# Patient Record
Sex: Male | Born: 1950 | Race: White | Hispanic: No | Marital: Married | Smoking: Former smoker
Health system: Southern US, Community
[De-identification: ages and names within clinical notes are randomized; demographics above are authoritative.]

---

## 2010-05-10 ENCOUNTER — Ambulatory Visit (HOSPITAL_COMMUNITY): Admission: RE | Admit: 2010-05-10 | Discharge: 2010-05-10 | Payer: Self-pay | Admitting: Gastroenterology

## 2013-01-19 ENCOUNTER — Other Ambulatory Visit: Payer: Self-pay | Admitting: Otolaryngology

## 2018-09-29 DIAGNOSIS — J309 Allergic rhinitis, unspecified: Secondary | ICD-10-CM | POA: Diagnosis not present

## 2018-09-29 DIAGNOSIS — I10 Essential (primary) hypertension: Secondary | ICD-10-CM | POA: Diagnosis not present

## 2018-09-29 DIAGNOSIS — Z136 Encounter for screening for cardiovascular disorders: Secondary | ICD-10-CM | POA: Diagnosis not present

## 2018-09-29 DIAGNOSIS — E7800 Pure hypercholesterolemia, unspecified: Secondary | ICD-10-CM | POA: Diagnosis not present

## 2018-09-29 DIAGNOSIS — Z23 Encounter for immunization: Secondary | ICD-10-CM | POA: Diagnosis not present

## 2018-09-30 ENCOUNTER — Other Ambulatory Visit: Payer: Self-pay | Admitting: Family Medicine

## 2018-09-30 DIAGNOSIS — Z136 Encounter for screening for cardiovascular disorders: Secondary | ICD-10-CM

## 2018-10-08 ENCOUNTER — Ambulatory Visit
Admission: RE | Admit: 2018-10-08 | Discharge: 2018-10-08 | Disposition: A | Discharge status: Home / Self Care | Payer: Medicare PPO | Source: Ambulatory Visit | Attending: Family Medicine | Admitting: Family Medicine

## 2018-10-08 DIAGNOSIS — Z136 Encounter for screening for cardiovascular disorders: Secondary | ICD-10-CM

## 2018-10-08 DIAGNOSIS — Z87891 Personal history of nicotine dependence: Secondary | ICD-10-CM | POA: Diagnosis not present

## 2019-04-01 DIAGNOSIS — Z125 Encounter for screening for malignant neoplasm of prostate: Secondary | ICD-10-CM | POA: Diagnosis not present

## 2019-04-01 DIAGNOSIS — Z0000 Encounter for general adult medical examination without abnormal findings: Secondary | ICD-10-CM | POA: Diagnosis not present

## 2019-04-01 DIAGNOSIS — E7800 Pure hypercholesterolemia, unspecified: Secondary | ICD-10-CM | POA: Diagnosis not present

## 2019-04-01 DIAGNOSIS — I10 Essential (primary) hypertension: Secondary | ICD-10-CM | POA: Diagnosis not present

## 2019-04-01 DIAGNOSIS — J309 Allergic rhinitis, unspecified: Secondary | ICD-10-CM | POA: Diagnosis not present

## 2019-04-01 DIAGNOSIS — Z1389 Encounter for screening for other disorder: Secondary | ICD-10-CM | POA: Diagnosis not present

## 2019-04-06 DIAGNOSIS — Z125 Encounter for screening for malignant neoplasm of prostate: Secondary | ICD-10-CM | POA: Diagnosis not present

## 2019-04-06 DIAGNOSIS — E7800 Pure hypercholesterolemia, unspecified: Secondary | ICD-10-CM | POA: Diagnosis not present

## 2019-04-06 DIAGNOSIS — Z23 Encounter for immunization: Secondary | ICD-10-CM | POA: Diagnosis not present

## 2019-04-06 DIAGNOSIS — I10 Essential (primary) hypertension: Secondary | ICD-10-CM | POA: Diagnosis not present

## 2019-06-15 DIAGNOSIS — E7800 Pure hypercholesterolemia, unspecified: Secondary | ICD-10-CM | POA: Diagnosis not present

## 2019-07-26 DIAGNOSIS — Z23 Encounter for immunization: Secondary | ICD-10-CM | POA: Diagnosis not present

## 2019-10-04 DIAGNOSIS — J309 Allergic rhinitis, unspecified: Secondary | ICD-10-CM | POA: Diagnosis not present

## 2019-10-04 DIAGNOSIS — I10 Essential (primary) hypertension: Secondary | ICD-10-CM | POA: Diagnosis not present

## 2019-10-04 DIAGNOSIS — E7800 Pure hypercholesterolemia, unspecified: Secondary | ICD-10-CM | POA: Diagnosis not present

## 2020-04-03 DIAGNOSIS — J309 Allergic rhinitis, unspecified: Secondary | ICD-10-CM | POA: Diagnosis not present

## 2020-04-03 DIAGNOSIS — Z125 Encounter for screening for malignant neoplasm of prostate: Secondary | ICD-10-CM | POA: Diagnosis not present

## 2020-04-03 DIAGNOSIS — Z6833 Body mass index (BMI) 33.0-33.9, adult: Secondary | ICD-10-CM | POA: Diagnosis not present

## 2020-04-03 DIAGNOSIS — I10 Essential (primary) hypertension: Secondary | ICD-10-CM | POA: Diagnosis not present

## 2020-04-03 DIAGNOSIS — E7800 Pure hypercholesterolemia, unspecified: Secondary | ICD-10-CM | POA: Diagnosis not present

## 2020-04-03 DIAGNOSIS — Z0000 Encounter for general adult medical examination without abnormal findings: Secondary | ICD-10-CM | POA: Diagnosis not present

## 2020-04-03 DIAGNOSIS — Z1389 Encounter for screening for other disorder: Secondary | ICD-10-CM | POA: Diagnosis not present

## 2020-07-05 DIAGNOSIS — H2513 Age-related nuclear cataract, bilateral: Secondary | ICD-10-CM | POA: Diagnosis not present

## 2020-10-22 DIAGNOSIS — Z6831 Body mass index (BMI) 31.0-31.9, adult: Secondary | ICD-10-CM | POA: Diagnosis not present

## 2020-10-22 DIAGNOSIS — J309 Allergic rhinitis, unspecified: Secondary | ICD-10-CM | POA: Diagnosis not present

## 2020-10-22 DIAGNOSIS — E7800 Pure hypercholesterolemia, unspecified: Secondary | ICD-10-CM | POA: Diagnosis not present

## 2020-10-22 DIAGNOSIS — I10 Essential (primary) hypertension: Secondary | ICD-10-CM | POA: Diagnosis not present

## 2020-11-07 IMAGING — US US ABDOMINAL AORTA SCREENING AAA
1 series · 14 of 20 positions shown · non-contrast
Comparison: None.

CLINICAL DATA: Appropriate age.  First time exam.  Smoking history.

EXAM:
US ABDOMINAL AORTA MEDICARE SCREENING
TECHNIQUE: Ultrasound examination of the abdominal aorta was performed as a
screening evaluation for abdominal aortic aneurysm.

[Series 1: us abdominal aorta screening aaa · 0.36mm/px · 14 of 20 slices shown]
[im 1/20]
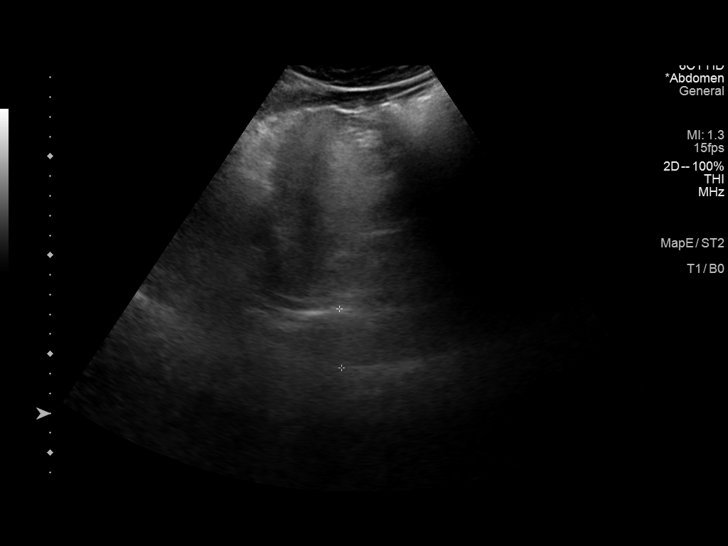
[im 3/20]
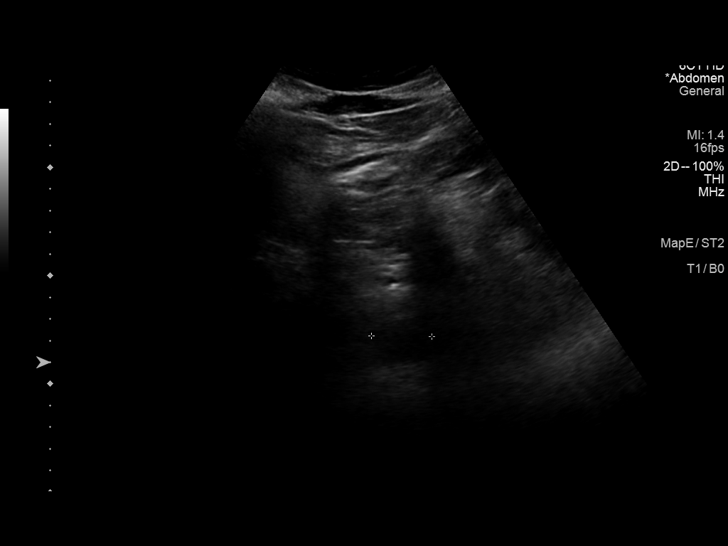
[im 4/20]
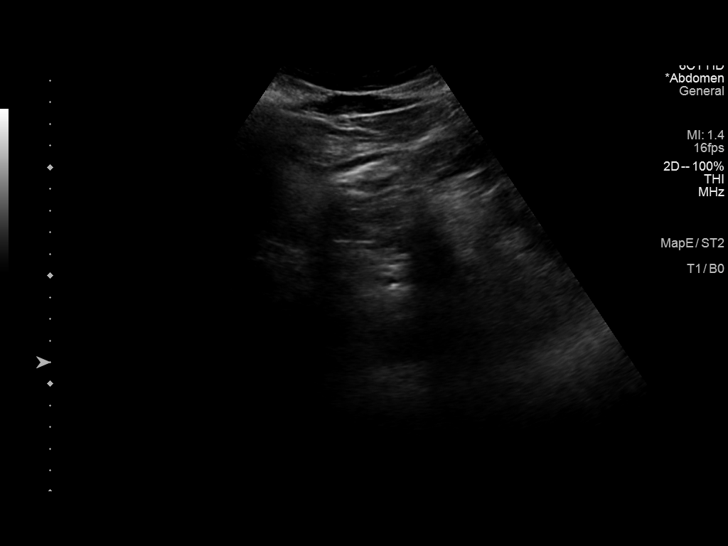
[im 6/20]
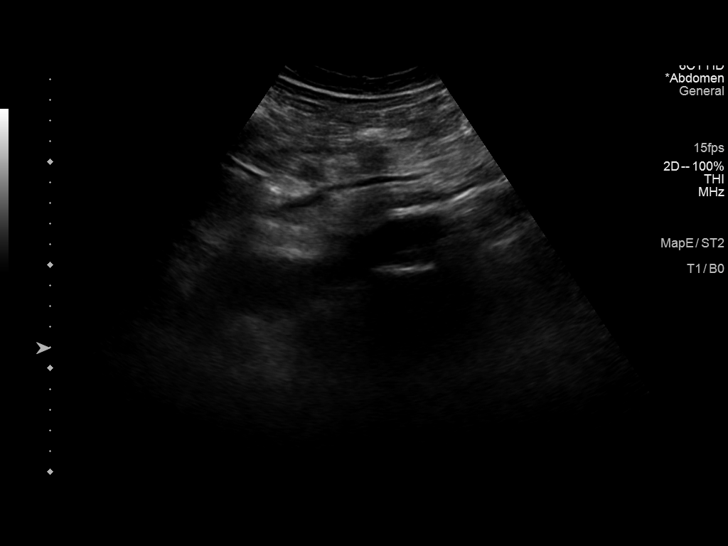
[im 7/20]
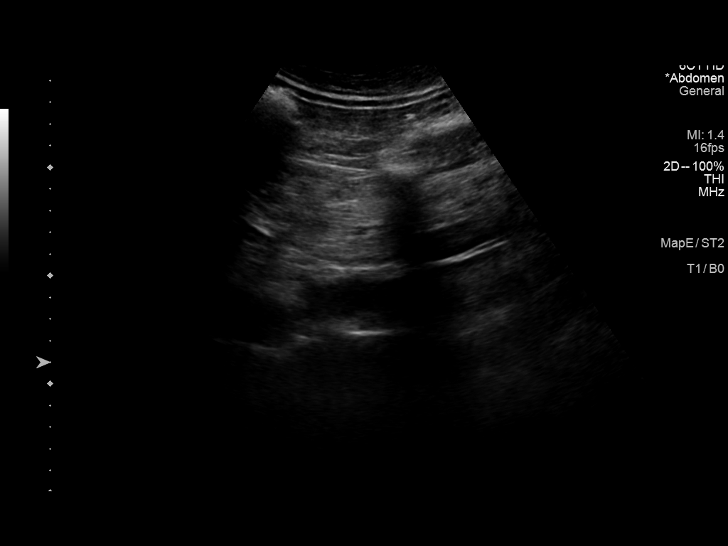
[im 8/20]
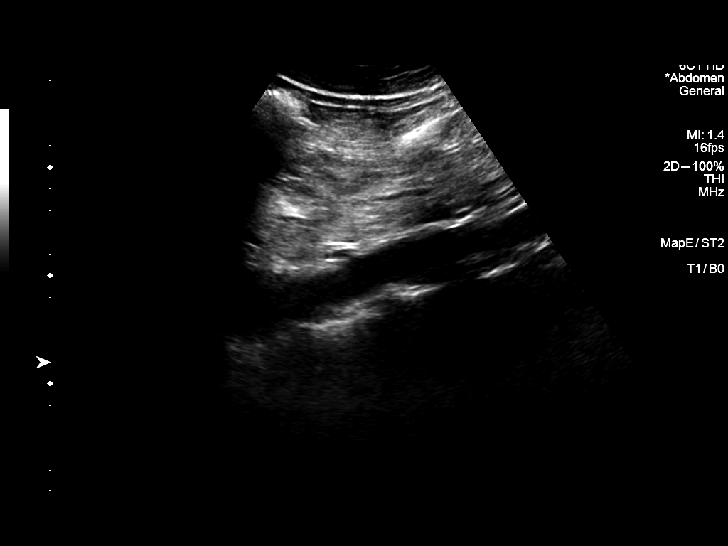
[im 10/20]
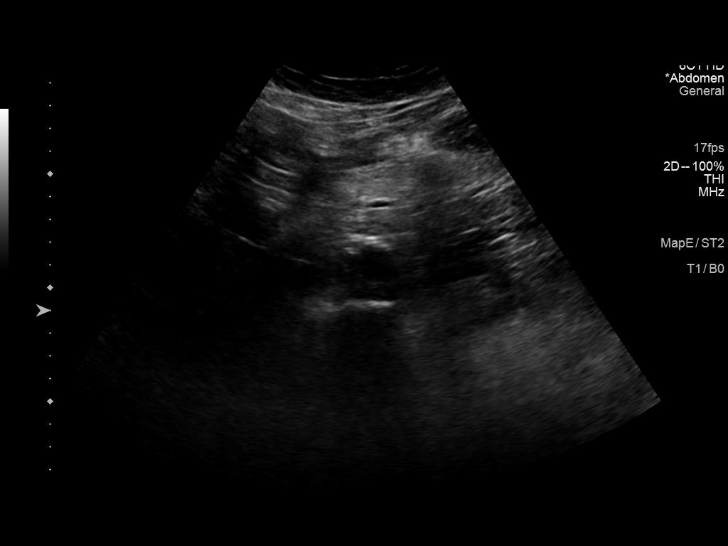
[im 11/20]
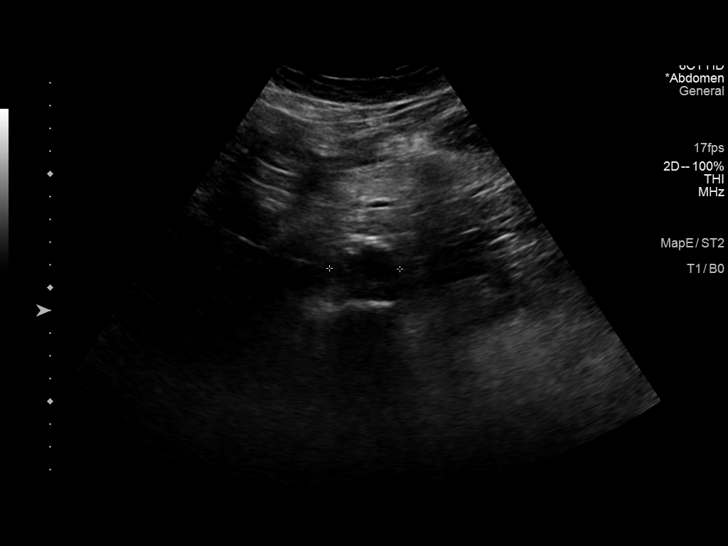
[im 13/20]
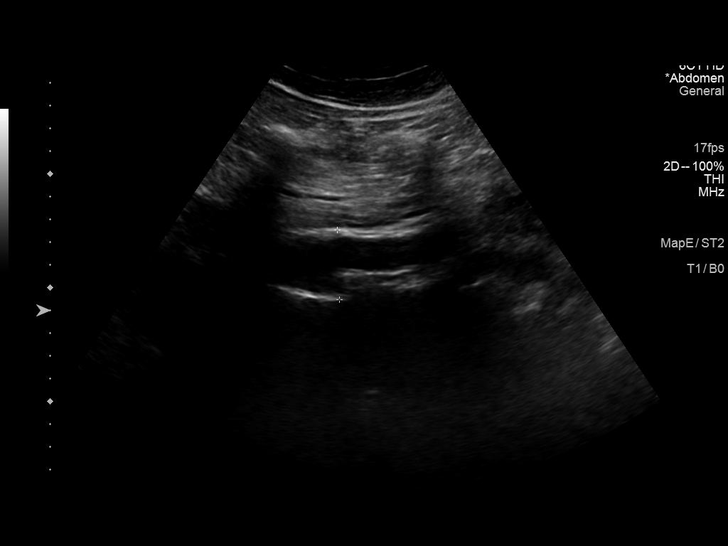
[im 14/20]
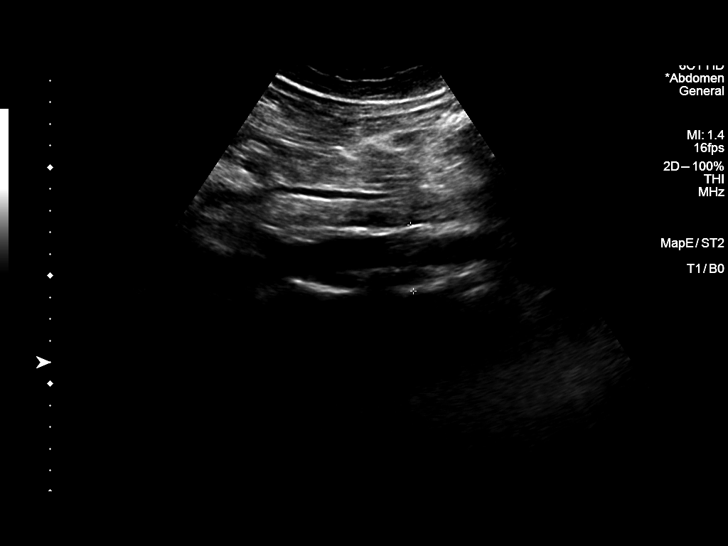
[im 16/20]
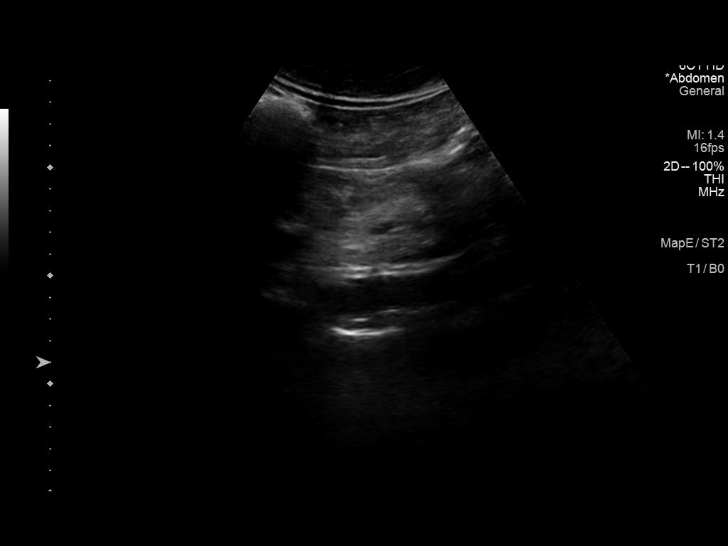
[im 17/20]
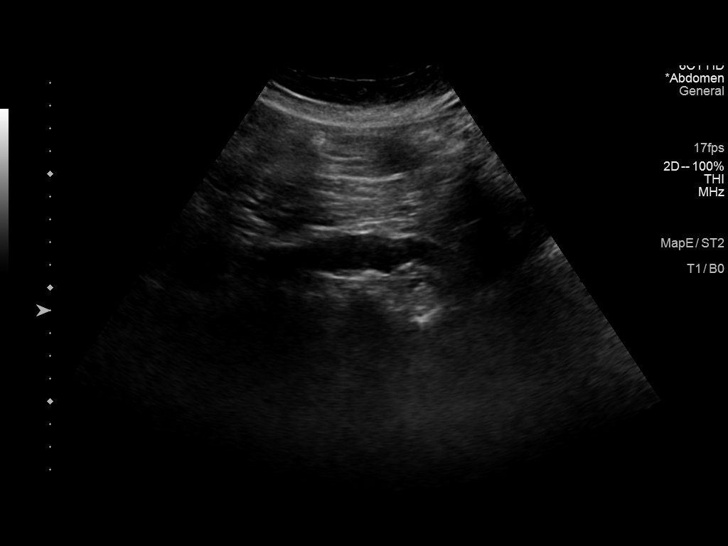
[im 18/20]
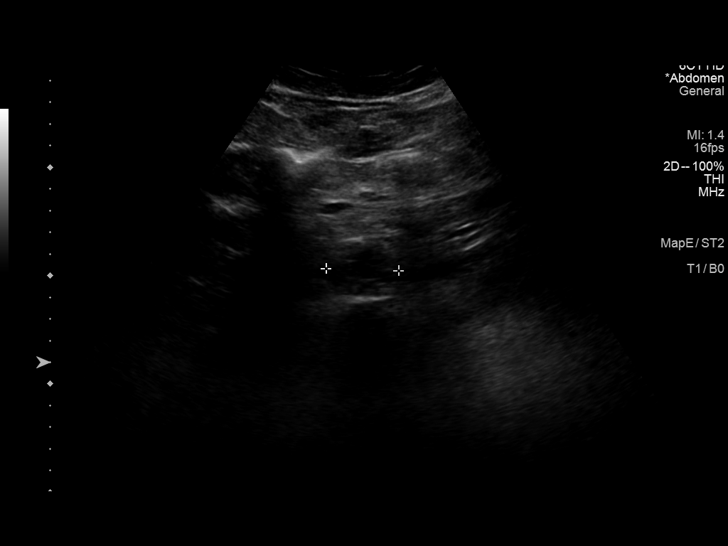
[im 20/20]
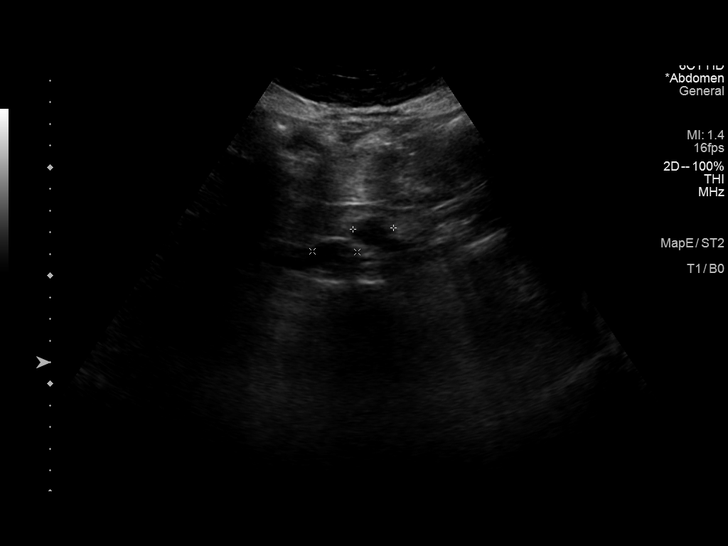

[14 of 20 positions shown; findings below may reference images not displayed]

FINDINGS: Abdominal aortic measurements as follows:

Proximal:  3.0 cm

Mid:  3.2 cm

Distal:  3.4 cm

Irregular atherosclerotic plaque is noted in the abdominal aorta.
IMPRESSION: Maximal diameter of the abdominal aorta is 3.4 cm. Recommend
followup by ultrasound in 3 years. This recommendation follows ACR
consensus guidelines: White Paper of the ACR Incidental Findings

## 2021-04-16 DIAGNOSIS — J309 Allergic rhinitis, unspecified: Secondary | ICD-10-CM | POA: Diagnosis not present

## 2021-04-16 DIAGNOSIS — Z125 Encounter for screening for malignant neoplasm of prostate: Secondary | ICD-10-CM | POA: Diagnosis not present

## 2021-04-16 DIAGNOSIS — Z0000 Encounter for general adult medical examination without abnormal findings: Secondary | ICD-10-CM | POA: Diagnosis not present

## 2021-04-16 DIAGNOSIS — M25561 Pain in right knee: Secondary | ICD-10-CM | POA: Diagnosis not present

## 2021-04-16 DIAGNOSIS — Z1389 Encounter for screening for other disorder: Secondary | ICD-10-CM | POA: Diagnosis not present

## 2021-04-16 DIAGNOSIS — I10 Essential (primary) hypertension: Secondary | ICD-10-CM | POA: Diagnosis not present

## 2021-04-16 DIAGNOSIS — E669 Obesity, unspecified: Secondary | ICD-10-CM | POA: Diagnosis not present

## 2021-04-16 DIAGNOSIS — E7800 Pure hypercholesterolemia, unspecified: Secondary | ICD-10-CM | POA: Diagnosis not present

## 2021-10-23 DIAGNOSIS — E669 Obesity, unspecified: Secondary | ICD-10-CM | POA: Diagnosis not present

## 2021-10-23 DIAGNOSIS — J309 Allergic rhinitis, unspecified: Secondary | ICD-10-CM | POA: Diagnosis not present

## 2021-10-23 DIAGNOSIS — E7800 Pure hypercholesterolemia, unspecified: Secondary | ICD-10-CM | POA: Diagnosis not present

## 2021-10-23 DIAGNOSIS — I77819 Aortic ectasia, unspecified site: Secondary | ICD-10-CM | POA: Diagnosis not present

## 2021-10-23 DIAGNOSIS — M25561 Pain in right knee: Secondary | ICD-10-CM | POA: Diagnosis not present

## 2021-10-23 DIAGNOSIS — I10 Essential (primary) hypertension: Secondary | ICD-10-CM | POA: Diagnosis not present

## 2021-10-24 ENCOUNTER — Other Ambulatory Visit: Payer: Self-pay | Admitting: Family Medicine

## 2021-10-24 DIAGNOSIS — I77819 Aortic ectasia, unspecified site: Secondary | ICD-10-CM

## 2021-11-04 ENCOUNTER — Ambulatory Visit
Admission: RE | Admit: 2021-11-04 | Discharge: 2021-11-04 | Disposition: A | Discharge status: Home / Self Care | Payer: Medicare PPO | Source: Ambulatory Visit | Attending: Family Medicine | Admitting: Family Medicine

## 2021-11-04 ENCOUNTER — Other Ambulatory Visit: Payer: Self-pay

## 2021-11-04 DIAGNOSIS — I77819 Aortic ectasia, unspecified site: Secondary | ICD-10-CM

## 2021-12-31 DIAGNOSIS — E7800 Pure hypercholesterolemia, unspecified: Secondary | ICD-10-CM | POA: Diagnosis not present

## 2022-05-13 DIAGNOSIS — Z1331 Encounter for screening for depression: Secondary | ICD-10-CM | POA: Diagnosis not present

## 2022-05-13 DIAGNOSIS — M25561 Pain in right knee: Secondary | ICD-10-CM | POA: Diagnosis not present

## 2022-05-13 DIAGNOSIS — I10 Essential (primary) hypertension: Secondary | ICD-10-CM | POA: Diagnosis not present

## 2022-05-13 DIAGNOSIS — I77819 Aortic ectasia, unspecified site: Secondary | ICD-10-CM | POA: Diagnosis not present

## 2022-05-13 DIAGNOSIS — E7800 Pure hypercholesterolemia, unspecified: Secondary | ICD-10-CM | POA: Diagnosis not present

## 2022-05-13 DIAGNOSIS — Z125 Encounter for screening for malignant neoplasm of prostate: Secondary | ICD-10-CM | POA: Diagnosis not present

## 2022-05-13 DIAGNOSIS — J309 Allergic rhinitis, unspecified: Secondary | ICD-10-CM | POA: Diagnosis not present

## 2022-05-13 DIAGNOSIS — Z0000 Encounter for general adult medical examination without abnormal findings: Secondary | ICD-10-CM | POA: Diagnosis not present

## 2022-05-13 DIAGNOSIS — E669 Obesity, unspecified: Secondary | ICD-10-CM | POA: Diagnosis not present

## 2022-06-30 ENCOUNTER — Other Ambulatory Visit: Payer: Self-pay | Admitting: *Deleted

## 2022-06-30 DIAGNOSIS — Z87891 Personal history of nicotine dependence: Secondary | ICD-10-CM

## 2022-06-30 DIAGNOSIS — Z122 Encounter for screening for malignant neoplasm of respiratory organs: Secondary | ICD-10-CM

## 2022-07-29 ENCOUNTER — Encounter: Payer: Self-pay | Admitting: Acute Care

## 2022-07-29 ENCOUNTER — Ambulatory Visit (INDEPENDENT_AMBULATORY_CARE_PROVIDER_SITE_OTHER): Payer: Medicare PPO | Admitting: Acute Care

## 2022-07-29 DIAGNOSIS — Z87891 Personal history of nicotine dependence: Secondary | ICD-10-CM

## 2022-07-29 NOTE — Progress Notes (Signed)
Virtual Visit via Telephone Note  I connected with Aaron Leach on 07/29/22 at  1:30 PM EST by telephone and verified that I am speaking with the correct person using two identifiers.  Location: Patient:  At home Provider:  [address] [place], [place], [address]    I discussed the limitations, risks, security and privacy concerns of performing an evaluation and management service by telephone and the availability of in person appointments. I also discussed with the patient that there may be a patient responsible charge related to this service. The patient expressed understanding and agreed to proceed.   Shared Decision Making Visit Lung Cancer Screening Program [phone])   Eligibility: Age 77 y.o. Pack Years Smoking History Calculation 62.5  pack year smoking history (# packs/per year x # years smoked) Recent History of coughing up blood  no Unexplained weight loss? no ( >Than 15 pounds within the last 6 months ) Prior History Lung / other cancer no (Diagnosis within the last 5 years already requiring surveillance chest CT Scans). Smoking Status Former Smoker Former Smokers: Years since quit: 10 years  Quit Date: 2013  Visit Components: Discussion included one or more decision making aids. yes Discussion included risk/benefits of screening. yes Discussion included potential follow up diagnostic testing for abnormal scans. yes Discussion included meaning and risk of over diagnosis. yes Discussion included meaning and risk of False Positives. yes Discussion included meaning of total radiation exposure. yes  Counseling Included: Importance of adherence to annual lung cancer LDCT screening. yes Impact of comorbidities on ability to participate in the program. yes Ability and willingness to under diagnostic treatment. yes  Smoking Cessation Counseling: Current Smokers:  Discussed importance of smoking cessation. yes Information about tobacco cessation classes and  interventions provided to patient. yes Patient provided with "ticket" for LDCT Scan. yes Symptomatic Patient. no  Counseling NA Diagnosis Code: Tobacco Use Z72.0 Asymptomatic Patient yes  Counseling (Intermediate counseling: > three minutes counseling) [id number] Former Smokers:  Discussed the importance of maintaining cigarette abstinence. yes Diagnosis Code: Personal History of Nicotine Dependence. W29.562 Information about tobacco cessation classes and interventions provided to patient. Yes Patient provided with "ticket" for LDCT Scan. yes Written Order for Lung Cancer Screening with LDCT placed in Epic. Yes (CT Chest Lung Cancer Screening Low Dose W/O CM) [id number] Z12.2-Screening of respiratory organs Z87.891-Personal history of nicotine dependence  I spent 25 minutes of face to face time/virtual visit time  with  Mr. Beasley discussing the risks and benefits of lung cancer screening. We took the time to pause the power point  Mr. Wachter intervals to allow for questions to be asked and answered to ensure understanding. We discussed that he had taken the single most powerful action possible to decrease his risk of developing lung cancer when he quit smoking. I counseled him to remain smoke free, and to contact me if he ever had the desire to smoke again so that I can provide resources and tools to help support the effort to remain smoke free. We discussed the time and location of the scan, and that either  Abigail Miyamoto RN, Karlton Lemon, RN or I  or I will call / send a letter with the results within  24-72 hours of receiving them. He has the office contact information in the event he needs to speak with me,  he verbalized understanding of all of the above and had no further questions upon leaving the office.     I explained to the patient  that there has been a high incidence of coronary artery disease noted on these exams. I explained that this is a non-gated exam therefore degree or severity  cannot be determined. This patient is on statin therapy. I have asked the patient to follow-up with their PCP regarding any incidental finding of coronary artery disease and management with diet or medication as they feel is clinically indicated. The patient verbalized understanding of the above and had no further questions.    Magdalen Spatz, NP 07/29/2022

## 2022-07-29 NOTE — Patient Instructions (Signed)
Thank you for participating in the [facility]. It was our pleasure to meet you today. We will call you with the results of your scan within the next few days. Your scan will be assigned a Lung RADS category score by the physicians reading the scans.  This Lung RADS score determines follow up scanning.  See below for description of categories, and follow up screening recommendations. We will be in touch to schedule your follow up screening annually or based on recommendations of our providers. We will fax a copy of your scan results to your Primary Care Physician, or the physician who referred you to the program, to ensure they have the results. Please call the office if you have any questions or concerns regarding your scanning experience or results.  Our office number is [phone]. Please speak with Denise Phelps, RN. , or  Denise Buckner RN, They are  our Lung Cancer Screening RN.'s If They are unavailable when you call, Please leave a message on the voice mail. We will return your call at our earliest convenience.This voice mail is monitored several times a day.  Remember, if your scan is normal, we will scan you annually as long as you continue to meet the criteria for the program. (Age 55-77, Current smoker or smoker who has quit within the last 15 years). If you are a smoker, remember, quitting is the single most powerful action that you can take to decrease your risk of lung cancer and other pulmonary, breathing related problems. We know quitting is hard, and we are here to help.  Please let us know if there is anything we can do to help you meet your goal of quitting. If you are a former smoker, congratulations. We are proud of you! Remain smoke free! Remember you can refer friends or family members through the number above.  We will screen them to make sure they meet criteria for the program. Thank you for helping us take better care of you by  participating in Lung Screening.  You can receive free nicotine replacement therapy ( patches, gum or mints) by calling 1-800-QUIT NOW. Please call so we can get you on the path to becoming  a non-smoker. I know it is hard, but you can do this!  Lung RADS Categories:  Lung RADS 1: no nodules or definitely non-concerning nodules.  Recommendation is for a repeat annual scan in 12 months.  Lung RADS 2:  nodules that are non-concerning in appearance and behavior with a very low likelihood of becoming an active cancer. Recommendation is for a repeat annual scan in 12 months.  Lung RADS 3: nodules that are probably non-concerning , includes nodules with a low likelihood of becoming an active cancer.  Recommendation is for a 6-month repeat screening scan. Often noted after an upper respiratory illness. We will be in touch to make sure you have no questions, and to schedule your 6-month scan.  Lung RADS 4 A: nodules with concerning findings, recommendation is most often for a follow up scan in 3 months or additional testing based on our provider's assessment of the scan. We will be in touch to make sure you have no questions and to schedule the recommended 3 month follow up scan.  Lung RADS 4 B:  indicates findings that are concerning. We will be in touch with you to schedule additional diagnostic testing based on our provider's  assessment of the scan.  Other options for assistance in smoking cessation (   As covered by your insurance benefits)  Hypnosis for smoking cessation  [employer] [phone]  Acupuncture for smoking cessation  [facility] [phone]   

## 2022-07-30 ENCOUNTER — Ambulatory Visit
Admission: RE | Admit: 2022-07-30 | Discharge: 2022-07-30 | Disposition: A | Discharge status: Home / Self Care | Payer: Medicare PPO | Source: Ambulatory Visit | Attending: Acute Care | Admitting: Acute Care

## 2022-07-30 DIAGNOSIS — Z122 Encounter for screening for malignant neoplasm of respiratory organs: Secondary | ICD-10-CM

## 2022-07-30 DIAGNOSIS — I358 Other nonrheumatic aortic valve disorders: Secondary | ICD-10-CM | POA: Diagnosis not present

## 2022-07-30 DIAGNOSIS — J432 Centrilobular emphysema: Secondary | ICD-10-CM | POA: Diagnosis not present

## 2022-07-30 DIAGNOSIS — I2510 Atherosclerotic heart disease of native coronary artery without angina pectoris: Secondary | ICD-10-CM | POA: Diagnosis not present

## 2022-07-30 DIAGNOSIS — Z87891 Personal history of nicotine dependence: Secondary | ICD-10-CM | POA: Diagnosis not present

## 2022-07-31 ENCOUNTER — Other Ambulatory Visit: Payer: Self-pay

## 2022-07-31 ENCOUNTER — Telehealth: Payer: Self-pay | Admitting: Acute Care

## 2022-07-31 DIAGNOSIS — R911 Solitary pulmonary nodule: Secondary | ICD-10-CM

## 2022-07-31 DIAGNOSIS — Z87891 Personal history of nicotine dependence: Secondary | ICD-10-CM

## 2022-07-31 NOTE — Telephone Encounter (Signed)
Results of recent LDCT reviewed with patient, using two patient identifiers. Nodule noted in right lung with recommendation to repeat LDCT in 6 months as precaution. No comparison imaging available.  Nodule is likely benign and as precaution, would recommend 6 months review instead of waiting a year.  Patient in agreement.  Also noted was emphysema and atherosclerosis.  Patient was aware he has copd and also takes stating medication. Order placed for nodule follow up CT in 6 months and results/plan faxed to PCP.  Patient had no questions.

## 2022-08-01 DIAGNOSIS — E7800 Pure hypercholesterolemia, unspecified: Secondary | ICD-10-CM | POA: Diagnosis not present

## 2022-10-14 DIAGNOSIS — D123 Benign neoplasm of transverse colon: Secondary | ICD-10-CM | POA: Diagnosis not present

## 2022-10-14 DIAGNOSIS — K635 Polyp of colon: Secondary | ICD-10-CM | POA: Diagnosis not present

## 2022-10-14 DIAGNOSIS — Z09 Encounter for follow-up examination after completed treatment for conditions other than malignant neoplasm: Secondary | ICD-10-CM | POA: Diagnosis not present

## 2022-10-14 DIAGNOSIS — Z86010 Personal history of colonic polyps: Secondary | ICD-10-CM | POA: Diagnosis not present

## 2022-10-14 DIAGNOSIS — K5730 Diverticulosis of large intestine without perforation or abscess without bleeding: Secondary | ICD-10-CM | POA: Diagnosis not present

## 2023-01-20 DIAGNOSIS — H2513 Age-related nuclear cataract, bilateral: Secondary | ICD-10-CM | POA: Diagnosis not present

## 2023-01-20 DIAGNOSIS — H52201 Unspecified astigmatism, right eye: Secondary | ICD-10-CM | POA: Diagnosis not present

## 2023-01-20 DIAGNOSIS — H25013 Cortical age-related cataract, bilateral: Secondary | ICD-10-CM | POA: Diagnosis not present

## 2023-01-20 DIAGNOSIS — H524 Presbyopia: Secondary | ICD-10-CM | POA: Diagnosis not present

## 2023-01-20 DIAGNOSIS — H5211 Myopia, right eye: Secondary | ICD-10-CM | POA: Diagnosis not present

## 2023-02-04 ENCOUNTER — Other Ambulatory Visit: Payer: Medicare PPO

## 2023-02-27 ENCOUNTER — Ambulatory Visit
Admission: RE | Admit: 2023-02-27 | Discharge: 2023-02-27 | Disposition: A | Discharge status: Home / Self Care | Payer: Medicare PPO | Source: Ambulatory Visit | Attending: Acute Care | Admitting: Acute Care

## 2023-02-27 DIAGNOSIS — R911 Solitary pulmonary nodule: Secondary | ICD-10-CM

## 2023-02-27 DIAGNOSIS — Z87891 Personal history of nicotine dependence: Secondary | ICD-10-CM

## 2023-04-03 ENCOUNTER — Telehealth: Payer: Self-pay | Admitting: Acute Care

## 2023-04-03 DIAGNOSIS — Z87891 Personal history of nicotine dependence: Secondary | ICD-10-CM

## 2023-04-03 DIAGNOSIS — R911 Solitary pulmonary nodule: Secondary | ICD-10-CM

## 2023-04-03 NOTE — Telephone Encounter (Signed)
I have called the patient with the results of his low dose CT chest. This was a 6 month follow up after a LR 3 that was done 07/2022.  The June scan was read as a LR 3 also. The previously noted nodules were stable, However , there was a  New part solid nodule of the right lung apex measuring 7.1 mm with 2.5 mm solid component, and a New ground-glass nodule of the upper lobe measuring 5.3 mm. Because of the new nodules, we will do a 6 month follow up to ensure stability. Patient is in agreement with this plan.  Sherre Lain, and Lane, please place order for 6 month follow up low dose CT Chest, and fax results to PCP letting them know plan for follow up. Thanks so much

## 2023-04-06 NOTE — Telephone Encounter (Signed)
Results/ plans faxed to PCP. Order placed for 6 mth nodule f/u lung screening CT.

## 2023-05-15 DIAGNOSIS — Z125 Encounter for screening for malignant neoplasm of prostate: Secondary | ICD-10-CM | POA: Diagnosis not present

## 2023-05-15 DIAGNOSIS — F17201 Nicotine dependence, unspecified, in remission: Secondary | ICD-10-CM | POA: Diagnosis not present

## 2023-05-15 DIAGNOSIS — Z0000 Encounter for general adult medical examination without abnormal findings: Secondary | ICD-10-CM | POA: Diagnosis not present

## 2023-05-15 DIAGNOSIS — J439 Emphysema, unspecified: Secondary | ICD-10-CM | POA: Diagnosis not present

## 2023-05-15 DIAGNOSIS — I700 Atherosclerosis of aorta: Secondary | ICD-10-CM | POA: Diagnosis not present

## 2023-05-15 DIAGNOSIS — I2510 Atherosclerotic heart disease of native coronary artery without angina pectoris: Secondary | ICD-10-CM | POA: Diagnosis not present

## 2023-05-15 DIAGNOSIS — I77819 Aortic ectasia, unspecified site: Secondary | ICD-10-CM | POA: Diagnosis not present

## 2023-05-15 DIAGNOSIS — R7301 Impaired fasting glucose: Secondary | ICD-10-CM | POA: Diagnosis not present

## 2023-05-15 DIAGNOSIS — E7800 Pure hypercholesterolemia, unspecified: Secondary | ICD-10-CM | POA: Diagnosis not present

## 2023-05-15 DIAGNOSIS — I10 Essential (primary) hypertension: Secondary | ICD-10-CM | POA: Diagnosis not present

## 2023-05-25 DIAGNOSIS — E876 Hypokalemia: Secondary | ICD-10-CM | POA: Diagnosis not present

## 2023-08-25 ENCOUNTER — Encounter: Payer: Self-pay | Admitting: Acute Care

## 2023-09-01 ENCOUNTER — Inpatient Hospital Stay
Admission: RE | Admit: 2023-09-01 | Discharge: 2023-09-01 | Disposition: A | Discharge status: Home / Self Care | Payer: Medicare PPO | Source: Ambulatory Visit

## 2023-09-01 DIAGNOSIS — R911 Solitary pulmonary nodule: Secondary | ICD-10-CM

## 2023-09-01 DIAGNOSIS — Z87891 Personal history of nicotine dependence: Secondary | ICD-10-CM

## 2023-09-01 DIAGNOSIS — R918 Other nonspecific abnormal finding of lung field: Secondary | ICD-10-CM | POA: Diagnosis not present

## 2023-09-14 ENCOUNTER — Other Ambulatory Visit: Payer: Self-pay

## 2023-09-14 DIAGNOSIS — Z87891 Personal history of nicotine dependence: Secondary | ICD-10-CM

## 2023-09-14 DIAGNOSIS — Z122 Encounter for screening for malignant neoplasm of respiratory organs: Secondary | ICD-10-CM

## 2023-12-05 IMAGING — US US AORTA
1 series · 13 of 25 positions shown · non-contrast
Comparison: 10/08/2018

CLINICAL DATA: Prior aortic ultrasound demonstrated aneurysmal
dilatation of the abdominal aorta.

EXAM:
ULTRASOUND OF ABDOMINAL AORTA
TECHNIQUE: Ultrasound examination of the abdominal aorta and proximal common
iliac arteries was performed to evaluate for aneurysm. Additional
color and Doppler images of the distal aorta were obtained to
document patency.

[Series 1: us aorta · 0.28mm/px · 13 of 28 slices shown]
[im 1/28]
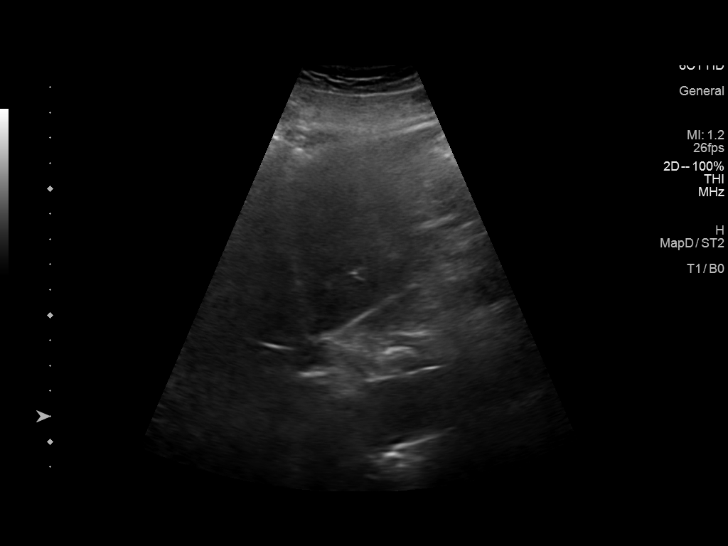
[im 3/28]
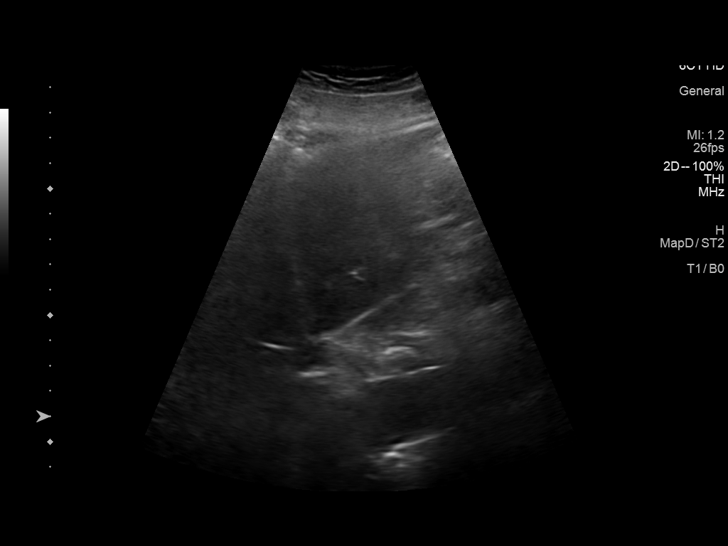
[im 5/28]
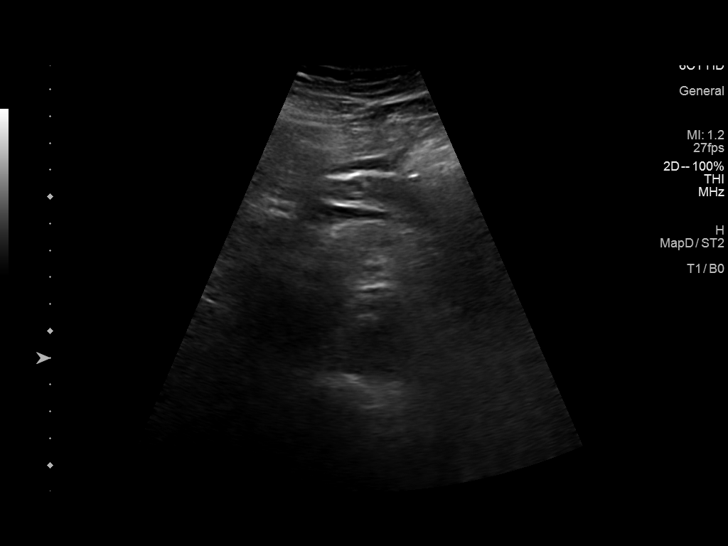
[im 7/28]
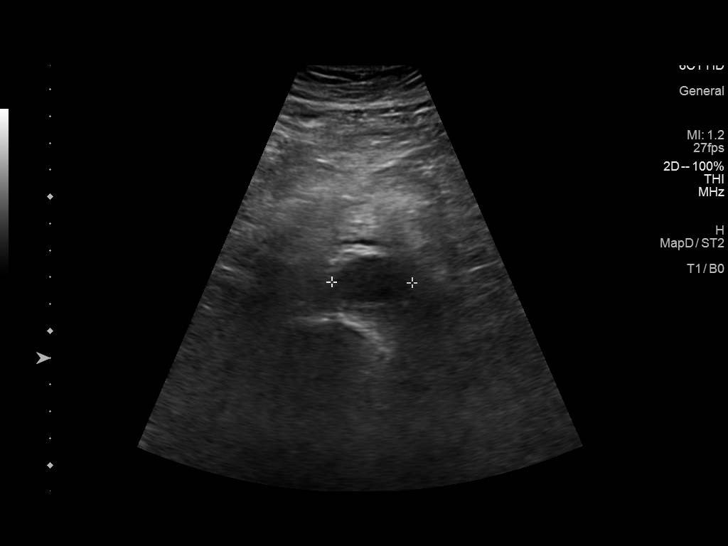
[im 10/28]
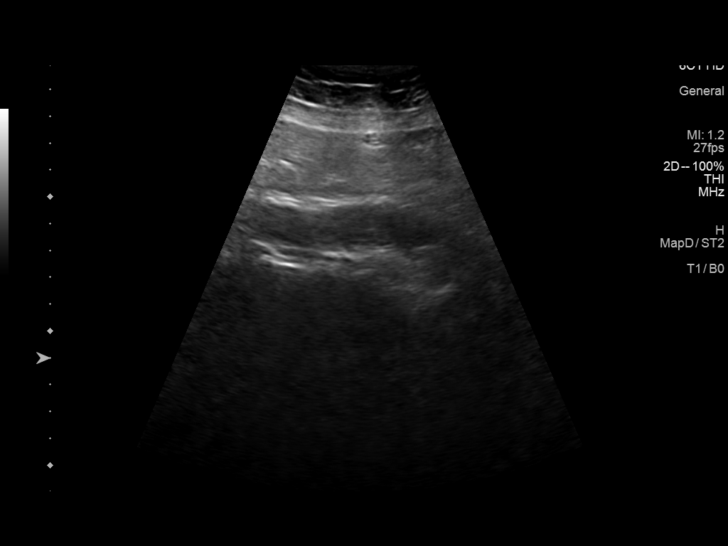
[im 12/28]
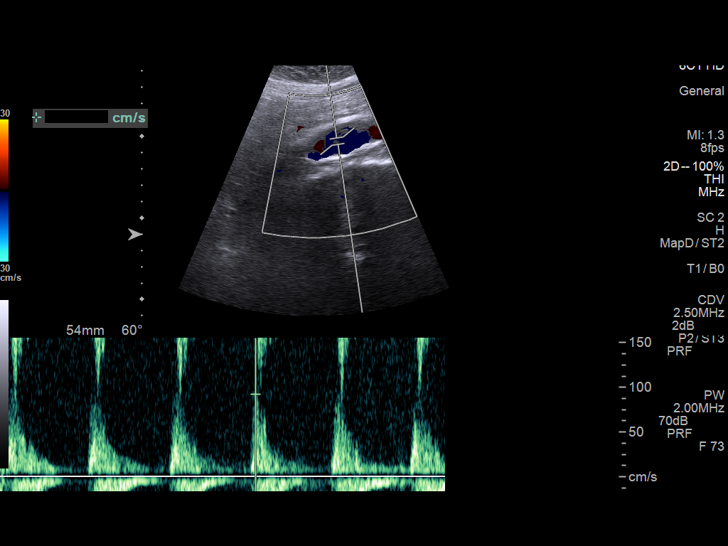
[im 14/28]
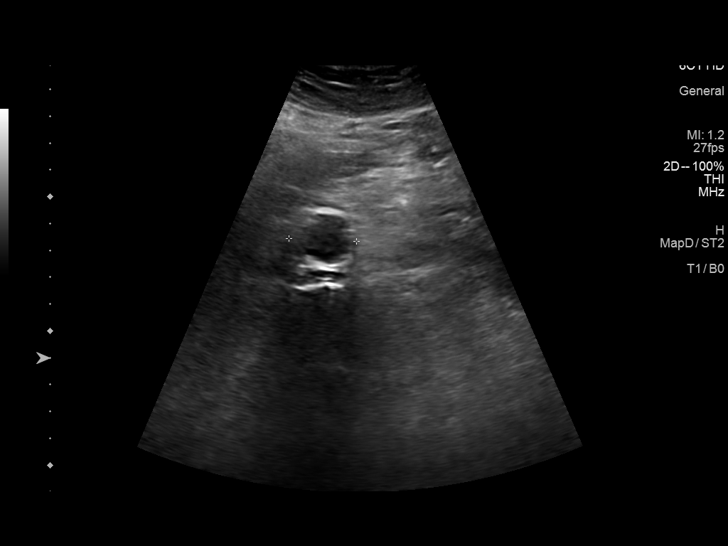
[im 16/28]
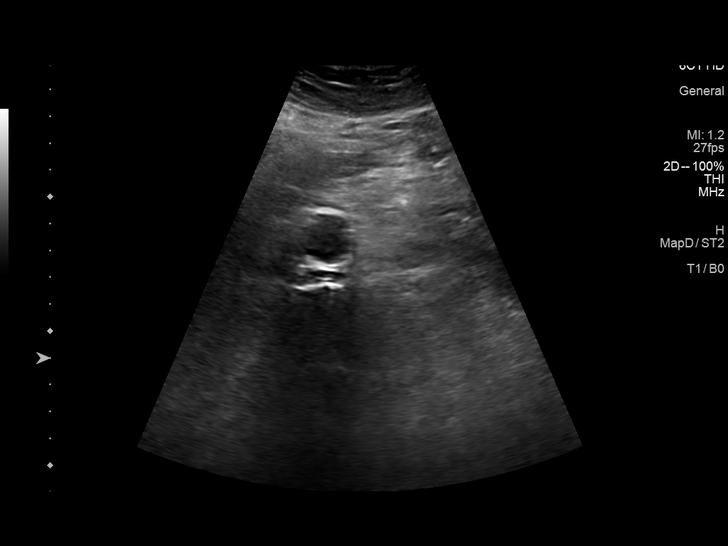
[im 19/28]
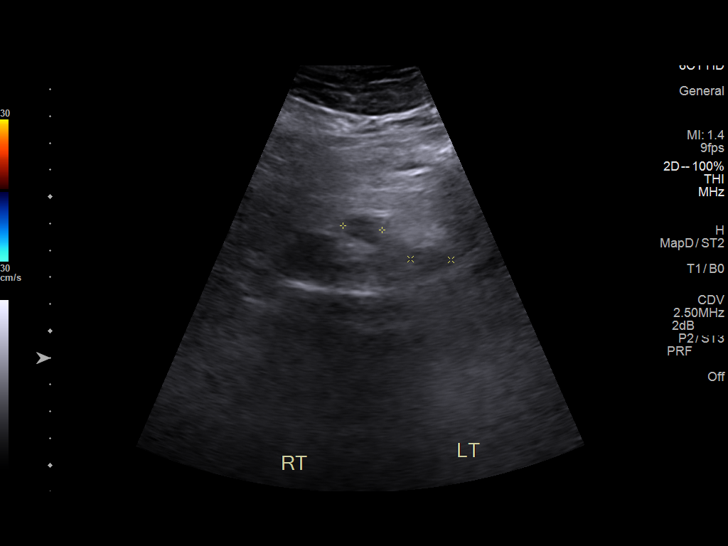
[im 21/28]
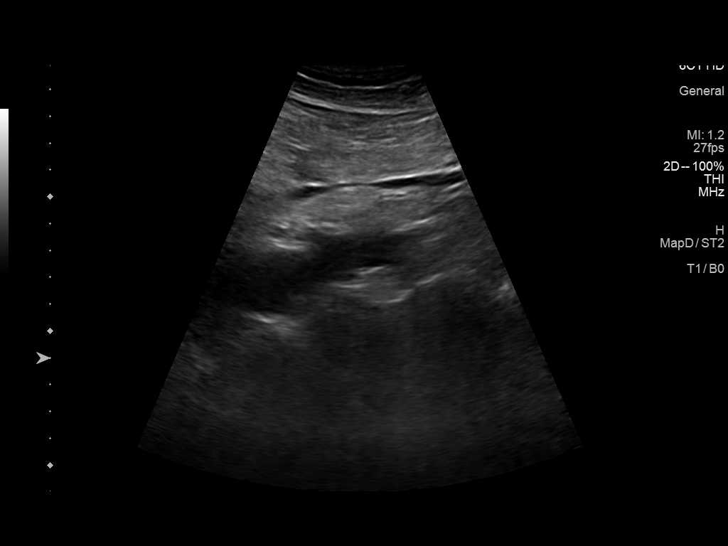
[im 23/28]
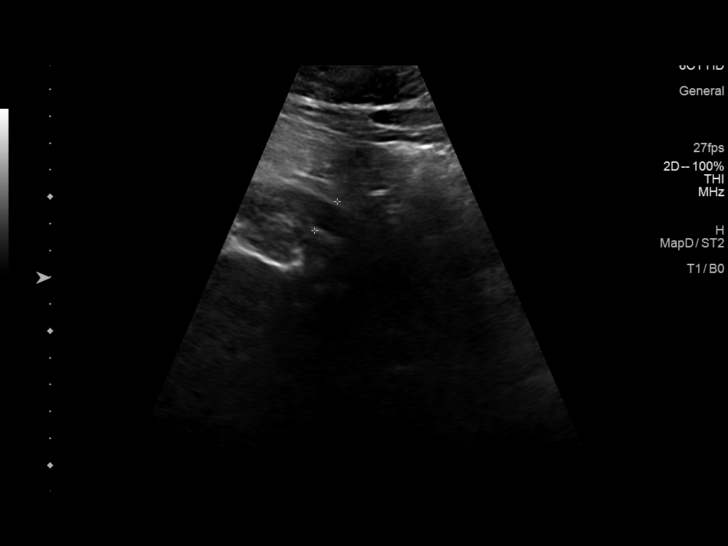
[im 25/28]
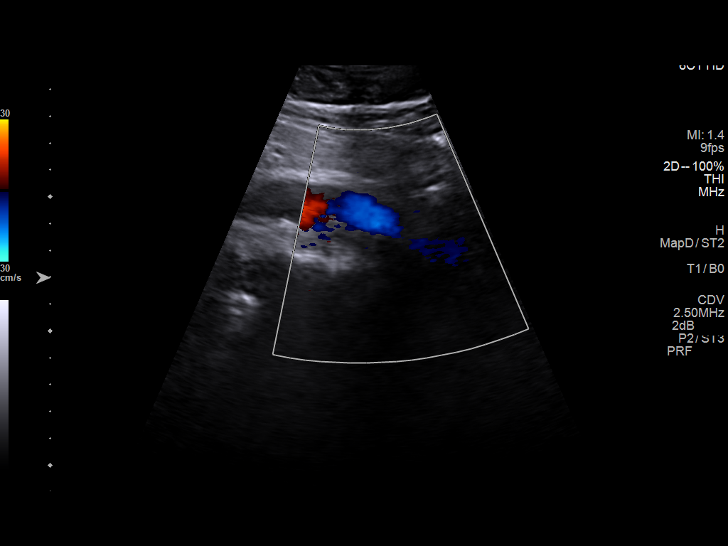
[im 28/28]
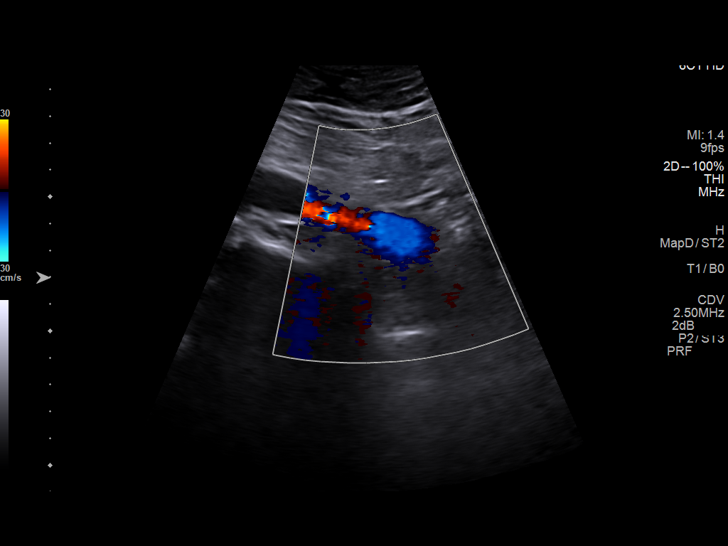

[13 of 25 positions shown; findings below may reference images not displayed]

FINDINGS: Abdominal aortic measurements as follows:

Proximal:  2.9 x 2.9 cm

Mid:  2.9 x 3.0 cm

Distal:  2.5 x 2.5 cm
Patent: Yes, peak systolic velocity is 92 cm/s

Right common iliac artery: 1.5 cm

Left common iliac artery: 1.5 cm

Diffuse atherosclerosis of the abdominal aorta again noted. There
remains some limitations in visualization of the abdominal aorta due
to body habitus but on the current exam the aortic measurements
definitely appear to be slightly smaller than on the prior exam.
IMPRESSION: Slightly smaller dimensions by ultrasound of the abdominal aorta
with borderline aneurysmal dilatation of the mid abdominal aorta.
Diffuse atherosclerosis again noted.

Recommend follow-up ultrasound every 3 years. This recommendation
follows ACR consensus guidelines: White Paper of the ACR Incidental

## 2024-05-23 DIAGNOSIS — Z0000 Encounter for general adult medical examination without abnormal findings: Secondary | ICD-10-CM | POA: Diagnosis not present

## 2024-05-23 DIAGNOSIS — I10 Essential (primary) hypertension: Secondary | ICD-10-CM | POA: Diagnosis not present

## 2024-05-23 DIAGNOSIS — I77819 Aortic ectasia, unspecified site: Secondary | ICD-10-CM | POA: Diagnosis not present

## 2024-05-23 DIAGNOSIS — I2510 Atherosclerotic heart disease of native coronary artery without angina pectoris: Secondary | ICD-10-CM | POA: Diagnosis not present

## 2024-05-23 DIAGNOSIS — F17201 Nicotine dependence, unspecified, in remission: Secondary | ICD-10-CM | POA: Diagnosis not present

## 2024-05-23 DIAGNOSIS — E7800 Pure hypercholesterolemia, unspecified: Secondary | ICD-10-CM | POA: Diagnosis not present

## 2024-05-23 DIAGNOSIS — Z23 Encounter for immunization: Secondary | ICD-10-CM | POA: Diagnosis not present

## 2024-05-23 DIAGNOSIS — Z87891 Personal history of nicotine dependence: Secondary | ICD-10-CM | POA: Diagnosis not present

## 2024-05-23 DIAGNOSIS — R7303 Prediabetes: Secondary | ICD-10-CM | POA: Diagnosis not present

## 2024-05-23 DIAGNOSIS — J439 Emphysema, unspecified: Secondary | ICD-10-CM | POA: Diagnosis not present

## 2024-07-27 ENCOUNTER — Encounter: Payer: Self-pay | Admitting: Acute Care

## 2024-08-31 ENCOUNTER — Encounter: Payer: Self-pay | Admitting: Acute Care

## 2024-09-06 ENCOUNTER — Ambulatory Visit
Admission: RE | Admit: 2024-09-06 | Discharge: 2024-09-06 | Disposition: A | Discharge status: Home / Self Care | Source: Ambulatory Visit | Attending: Acute Care | Admitting: Acute Care

## 2024-09-06 DIAGNOSIS — Z122 Encounter for screening for malignant neoplasm of respiratory organs: Secondary | ICD-10-CM

## 2024-09-06 DIAGNOSIS — Z87891 Personal history of nicotine dependence: Secondary | ICD-10-CM

## 2024-09-16 ENCOUNTER — Telehealth: Payer: Self-pay | Admitting: Acute Care

## 2024-09-16 NOTE — Telephone Encounter (Signed)
 This is a 4 B that has grown 2 mm in  2 years. We are going to do Nodify and then determine if he needs a PET scan vs 3 month follow up scan. It is concerning for a slow growing primary bronchogenic cancer. We will order on Monday 09/19/2024. Please fax results to PCP and let them know plan of care. SABRA

## 2024-09-19 ENCOUNTER — Telehealth: Payer: Self-pay | Admitting: Acute Care

## 2024-09-19 DIAGNOSIS — R911 Solitary pulmonary nodule: Secondary | ICD-10-CM

## 2024-09-19 NOTE — Telephone Encounter (Signed)
See Telephone note.

## 2024-09-19 NOTE — Telephone Encounter (Signed)
 I have called the patient with the results of his low dose Ct Chest.  The scan was read as a LR 4 B. He has a slowly growing  9 mm spiculated nodule in the lateral right lower lobe, gradually increased in size from 7.0 mm on 07/30/2022. We discussed next best step is for a PET scan to further evaluate, and follow up with me in the office 1 week after the scan to review results. He is in agreement with this plan.  Please fax results to PCP and let them know plan of care.  Thanks so much

## 2024-09-19 NOTE — Telephone Encounter (Signed)
 Results and plan to PCP.

## 2024-09-28 ENCOUNTER — Encounter (HOSPITAL_COMMUNITY)
Admission: RE | Admit: 2024-09-28 | Discharge: 2024-09-28 | Disposition: A | Discharge status: Home / Self Care | Source: Ambulatory Visit | Attending: Acute Care | Admitting: Acute Care

## 2024-09-28 DIAGNOSIS — R911 Solitary pulmonary nodule: Secondary | ICD-10-CM | POA: Diagnosis present

## 2024-09-28 LAB — GLUCOSE, CAPILLARY: Glucose-Capillary: 105 mg/dL — ABNORMAL HIGH (ref 70–99)

## 2024-09-28 MED ORDER — FLUDEOXYGLUCOSE F - 18 (FDG) INJECTION
10.2400 | Freq: Once | INTRAVENOUS | Status: AC
  Administered 2024-09-28: 10.24 via INTRAVENOUS

## 2024-10-07 ENCOUNTER — Encounter: Payer: Self-pay | Admitting: Acute Care

## 2024-10-07 ENCOUNTER — Ambulatory Visit: Admitting: Acute Care

## 2024-10-07 ENCOUNTER — Other Ambulatory Visit: Payer: Self-pay | Admitting: *Deleted

## 2024-10-07 VITALS — BP 100/70 | HR 109 | Temp 98.3°F | Ht 69.0 in | Wt 206.0 lb

## 2024-10-07 DIAGNOSIS — R911 Solitary pulmonary nodule: Secondary | ICD-10-CM | POA: Diagnosis not present

## 2024-10-07 DIAGNOSIS — Z87891 Personal history of nicotine dependence: Secondary | ICD-10-CM | POA: Diagnosis not present

## 2024-10-07 DIAGNOSIS — R9389 Abnormal findings on diagnostic imaging of other specified body structures: Secondary | ICD-10-CM

## 2024-10-07 NOTE — Patient Instructions (Addendum)
 It is good to see you today. We have reviewed the results of the PET scan.  The nodule does not have uptake on the scan, but is still concerning for a slow growing cancer. We will do a 3 month follow up LDCT which will be due in April 2026. You will get a call to get this scheduled closer to the time. Follow up with Lauraine NP after the scan to review the results.  Call if you need us  for anything at all.  Please contact office for sooner follow up if symptoms do not improve or worsen or seek emergency care

## 2024-10-07 NOTE — Progress Notes (Signed)
 "  History of Present Illness Aaron Leach is a 74 y.o. male former smoker followed through the lung cancer screening program. He is here today fir consult after an abnormal lung cancer screening scan read as a 4 B. He will be followed by  the IP team.   10/07/2024 Discussed the use of AI scribe software for clinical note transcription with the patient, who gave verbal consent to proceed.  History of Present Illness Aaron Leach is a 74 year old male who presents for follow-up PET scan of a right lower lobe pulmonary nodule.  A right lower lobe pulmonary nodule was initially identified in November 2023, measuring approximately 6.8 millimeters. Over two years, it has shown slow growth, increasing to 9 millimeters by December 2025. The nodule has been monitored through imaging studies, including a PET scan.  The PET scan was read as the right lower lobe 8 mm pulmonary nodule is without hypermetabolic correlate. Given size and slow growth, lack of activity is not unexpected and indolent primary bronchogenic carcinoma such as low-grade adenocarcinoma remains a concern.  We discussed that while the PET scan did not show the nodule was hypermetabolic, we need to continue to monitor this closely. Plan is for a 3 month follow up LDCT, and if there has been continued growth, we will biopsy the nodule. Pt. And his wife are in agreement with the plan. The Lung Cancer Screening Program has ordered the follow up scan and will arrange the follow up visit 1 week after the scan.   He has no symptoms such as hemoptysis or unexplained weight loss. He has been on a diet since the COVID-19 pandemic, resulting in a gradual and intentional weight loss of approximately 100 pounds. He denies any current symptoms related to the nodule.   Test Results: 09/28/2024 The right lower lobe 8 mm pulmonary nodule is without hypermetabolic correlate. Given size and slow growth, lack of activity is not unexpected and indolent  primary bronchogenic carcinoma such as low-grade adenocarcinoma remains a concern. 2. Aortic atherosclerosis (ICD10-I70.0), coronary artery atherosclerosis and emphysema (ICD10-J43.9).     LDCT 09/06/2025 9 mm spiculated nodule in the lateral right lower lobe, gradually increased in size from 7.0 mm on 07/30/2022. Lung-RADS 4B, suspicious. Additional imaging evaluation or consultation with Pulmonology or Thoracic Surgery recommended. Please note that PET may yield a false negative in the setting of indolent growth.     No data to display              No data to display          BNP No results found for: BNP  ProBNP No results found for: PROBNP  PFT No results found for: FEV1PRE, FEV1POST, FVCPRE, FVCPOST, TLC, DLCOUNC, PREFEV1FVCRT, PSTFEV1FVCRT  NM PET Image Initial (PI) Skull Base To Thigh (F-18 FDG) Result Date: 10/04/2024 CLINICAL DATA:  Initial treatment strategy for lung cancer screening CT demonstrating a 9 mm right lower lobe pulmonary nodule. EXAM: NUCLEAR MEDICINE PET SKULL BASE TO THIGH TECHNIQUE: 10.2 mCi F-18 FDG was injected intravenously. Full-ring PET imaging was performed from the skull base to thigh after the radiotracer. CT data was obtained and used for attenuation correction and anatomic localization. Fasting blood glucose: 105 mg/dl COMPARISON:  87/76/7974 chest CT. FINDINGS: Mediastinal blood pool activity: SUV max 2.3 Liver activity: SUV max NA NECK: No areas of abnormal hypermetabolism. Incidental CT findings: Bilateral carotid atherosclerosis. No cervical adenopathy. CHEST: No thoracic nodal hypermetabolism. The right lower lobe pulmonary nodule of interest measures  8 mm. No correlate hypermetabolism including on image 51/7. Incidental CT findings: Mild centrilobular emphysema. Aortic and coronary artery calcification. ABDOMEN/PELVIS: No abdominopelvic parenchymal or nodal hypermetabolism. Incidental CT findings: Normal adrenal glands.  Abdominal aortic atherosclerosis. Scattered colonic diverticula. SKELETON: No abnormal marrow activity. Incidental CT findings: None. IMPRESSION: 1. The right lower lobe 8 mm pulmonary nodule is without hypermetabolic correlate. Given size and slow growth, lack of activity is not unexpected and indolent primary bronchogenic carcinoma such as low-grade adenocarcinoma remains a concern. 2. Aortic atherosclerosis (ICD10-I70.0), coronary artery atherosclerosis and emphysema (ICD10-J43.9). Electronically Signed   By: Rockey Kilts M.D.   On: 10/04/2024 11:21     Past medical hx History reviewed. No pertinent past medical history.   Social History[1]  Mr.Wissmann reports that he quit smoking about 13 years ago. His smoking use included cigarettes. He started smoking about 63 years ago. He has a 62.5 pack-year smoking history. He has never used smokeless tobacco.  Tobacco Cessation: Counseling given: Not Answered Former smoker with a 62.5 pack year smoking history, quit 2013.  Past surgical hx, Family hx, Social hx all reviewed.  Current Outpatient Medications on File Prior to Visit  Medication Sig   acetaminophen (TYLENOL) 500 MG tablet 1 tablet as needed Orally every 6 hrs   atorvastatin (LIPITOR) 10 MG tablet Take 10 mg by mouth once a week.   Naproxen Sod-diphenhydrAMINE (ALEVE PM) 220-25 MG TABS 2 tablets by mouth as needed   Olmesartan-amLODIPine-HCTZ 40-10-25 MG TABS Take 1 tablet by mouth daily.   No current facility-administered medications on file prior to visit.     Allergies[2]  Review Of Systems:  Constitutional:   No  weight loss, night sweats,  Fevers, chills, fatigue, or  lassitude.  HEENT:   No headaches,  Difficulty swallowing,  Tooth/dental problems, or  Sore throat,                No sneezing, itching, ear ache, nasal congestion, post nasal drip,   CV:  No chest pain,  Orthopnea, PND, swelling in lower extremities, anasarca, dizziness, palpitations, syncope.   GI  No  heartburn, indigestion, abdominal pain, nausea, vomiting, diarrhea, change in bowel habits, loss of appetite, bloody stools.   Resp: No shortness of breath with exertion or at rest.  No excess mucus, no productive cough,  No non-productive cough,  No coughing up of blood.  No change in color of mucus.  No wheezing.  No chest wall deformity  Skin: no rash or lesions.  GU: no dysuria, change in color of urine, no urgency or frequency.  No flank pain, no hematuria   MS:  No joint pain or swelling.  No decreased range of motion.  No back pain.  Psych:  No change in mood or affect. No depression or anxiety.  No memory loss.   Vital Signs BP 100/70   Pulse (!) 109   Temp 98.3 F (36.8 C) (Oral)   Ht 5' 9 (1.753 m)   Wt 206 lb (93.4 kg)   SpO2 96%   BMI 30.42 kg/m    Physical Exam: Physical Exam GENERAL: No distress, alert and oriented times 3. EARS NOSE THROAT: No sinus tenderness, tympanic membranes clear, pale nasal mucosa, no oral exudate, no post nasal drip, no lymphadenopathy. CHEST: No wheeze, rales, dullness, no accessory muscle use, no nasal flaring, no sternal retractions. CARDIAC: S1, S2, regular rate and rhythm, no murmur. ABDOMINAL: Soft, non tender. ND, BS present, EXTREMITIES: No clubbing, cyanosis, edema. No obvious  deformities NEUROLOGICAL: Normal strength. Alert and oriented x 3, MAE x 4 SKIN: No rashes, warm and dry. No obvious skin lesions PSYCHIATRIC: Normal mood and behavior.   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  PET scan shows no hypermetabolic activity, suggesting low malignancy risk but concerning for low grade adenocarcinoma due to size and growth. Plan - Scheduled 3-month follow-up low dose CT scan to monitor growth. - Plan robotic assisted navigational bronchoscopy with biopsy if growth continues. -  Discussed biopsy risks: bleeding, infection, pneumothorax. - Coordinated follow-up scheduling with nurse navigators. - Informed primary care provider of management plan.   I spent 25 minutes dedicated to the care of this patient on the date of this encounter to include pre-visit review of records, face-to-face time with the patient discussing conditions above, post visit ordering of testing, clinical documentation with the electronic health record, making appropriate referrals as documented, and communicating necessary information to the patient's healthcare team.     Lauraine JULIANNA Lites, NP 10/07/2024  10:47 AM             [1]  Social History Tobacco Use   Smoking status: Former    Current packs/day: 0.00    Average packs/day: 1.3 packs/day for 50.0 years (62.5 ttl pk-yrs)    Types: Cigarettes    Start date: 1963    Quit date: 2013    Years since quitting: 13.0   Smokeless tobacco: Never  [2] No Known Allergies  "

## 2024-10-17 NOTE — Addendum Note (Signed)
 Addended by: DIONISIO AQUAS on: 10/17/2024 02:32 PM   Modules accepted: Orders

## 2024-10-25 ENCOUNTER — Encounter: Admitting: Thoracic Surgery (Cardiothoracic Vascular Surgery)
